# Patient Record
Sex: Male | Born: 1965 | Hispanic: No | Smoking: Current every day smoker
Health system: Southern US, Community
[De-identification: ages and names within clinical notes are randomized; demographics above are authoritative.]

## PROBLEM LIST (undated history)

## (undated) DIAGNOSIS — I10 Essential (primary) hypertension: Secondary | ICD-10-CM

## (undated) HISTORY — PX: OTHER SURGICAL HISTORY: SHX169

---

## 2001-08-21 ENCOUNTER — Emergency Department (HOSPITAL_COMMUNITY): Admission: EM | Admit: 2001-08-21 | Discharge: 2001-08-21 | Payer: Self-pay | Admitting: Emergency Medicine

## 2001-08-21 ENCOUNTER — Encounter: Payer: Self-pay | Admitting: Emergency Medicine

## 2002-06-16 ENCOUNTER — Emergency Department (HOSPITAL_COMMUNITY): Admission: EM | Admit: 2002-06-16 | Discharge: 2002-06-16 | Payer: Self-pay | Admitting: Emergency Medicine

## 2003-08-23 ENCOUNTER — Emergency Department (HOSPITAL_COMMUNITY): Admission: EM | Admit: 2003-08-23 | Discharge: 2003-08-23 | Payer: Self-pay | Admitting: Emergency Medicine

## 2006-09-12 ENCOUNTER — Emergency Department (HOSPITAL_COMMUNITY): Admission: EM | Admit: 2006-09-12 | Discharge: 2006-09-12 | Payer: Self-pay | Admitting: Emergency Medicine

## 2006-10-04 ENCOUNTER — Emergency Department (HOSPITAL_COMMUNITY): Admission: EM | Admit: 2006-10-04 | Discharge: 2006-10-04 | Payer: Self-pay | Admitting: Emergency Medicine

## 2006-11-06 ENCOUNTER — Ambulatory Visit (HOSPITAL_COMMUNITY): Admission: RE | Admit: 2006-11-06 | Discharge: 2006-11-06 | Payer: Self-pay | Admitting: Family Medicine

## 2006-12-09 ENCOUNTER — Ambulatory Visit (HOSPITAL_COMMUNITY): Admission: RE | Admit: 2006-12-09 | Discharge: 2006-12-09 | Payer: Self-pay | Admitting: General Surgery

## 2007-09-15 ENCOUNTER — Emergency Department (HOSPITAL_COMMUNITY): Admission: EM | Admit: 2007-09-15 | Discharge: 2007-09-15 | Payer: Self-pay | Admitting: Family Medicine

## 2007-09-18 ENCOUNTER — Emergency Department (HOSPITAL_COMMUNITY): Admission: EM | Admit: 2007-09-18 | Discharge: 2007-09-18 | Payer: Self-pay | Admitting: Emergency Medicine

## 2007-10-12 ENCOUNTER — Emergency Department (HOSPITAL_COMMUNITY): Admission: EM | Admit: 2007-10-12 | Discharge: 2007-10-12 | Payer: Self-pay | Admitting: Emergency Medicine

## 2009-08-18 ENCOUNTER — Ambulatory Visit (HOSPITAL_COMMUNITY): Admission: RE | Admit: 2009-08-18 | Discharge: 2009-08-18 | Payer: Self-pay | Admitting: Internal Medicine

## 2009-08-28 ENCOUNTER — Ambulatory Visit (HOSPITAL_COMMUNITY): Admission: RE | Admit: 2009-08-28 | Discharge: 2009-08-28 | Payer: Self-pay | Admitting: Orthopaedic Surgery

## 2009-10-06 ENCOUNTER — Ambulatory Visit (HOSPITAL_COMMUNITY): Admission: RE | Admit: 2009-10-06 | Discharge: 2009-10-06 | Payer: Self-pay | Admitting: Orthopaedic Surgery

## 2010-06-10 LAB — CBC
Hemoglobin: 13.8 g/dL (ref 13.0–17.0)
MCH: 28.5 pg (ref 26.0–34.0)
MCHC: 33.2 g/dL (ref 30.0–36.0)
Platelets: 213 10*3/uL (ref 150–400)
RDW: 13.6 % (ref 11.5–15.5)

## 2010-06-10 LAB — BASIC METABOLIC PANEL
BUN: 14 mg/dL (ref 6–23)
CO2: 29 mEq/L (ref 19–32)
Calcium: 9.2 mg/dL (ref 8.4–10.5)
Creatinine, Ser: 0.83 mg/dL (ref 0.4–1.5)
GFR calc non Af Amer: >60 mL/min (ref >60)
Glucose, Bld: 96 mg/dL (ref 70–99)

## 2010-06-10 LAB — URINALYSIS, ROUTINE W REFLEX MICROSCOPIC
Bilirubin Urine: NEGATIVE
Hgb urine dipstick: NEGATIVE
Ketones, ur: NEGATIVE mg/dL
Nitrite: NEGATIVE
Specific Gravity, Urine: 1.02 (ref 1.005–1.030)
Urobilinogen, UA: 0.2 mg/dL (ref 0.0–1.0)

## 2010-06-10 LAB — SURGICAL PCR SCREEN
MRSA, PCR: NEGATIVE
Staphylococcus aureus: NEGATIVE
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
## 2010-12-20 LAB — POCT URINALYSIS DIP (DEVICE)
Bilirubin Urine: NEGATIVE
Glucose, UA: NEGATIVE
Specific Gravity, Urine: <=1.005
Urobilinogen, UA: 0.2
pH: 5

## 2010-12-20 LAB — URINALYSIS, ROUTINE W REFLEX MICROSCOPIC
Bilirubin Urine: NEGATIVE
Glucose, UA: NEGATIVE
Hgb urine dipstick: NEGATIVE
Specific Gravity, Urine: 1.015

## 2010-12-21 LAB — POCT I-STAT, CHEM 8
BUN: 20
Chloride: 108
Creatinine, Ser: 1
Glucose, Bld: 91
HCT: 47
Potassium: 4.5

## 2010-12-21 LAB — URINALYSIS, ROUTINE W REFLEX MICROSCOPIC
Glucose, UA: NEGATIVE
Hgb urine dipstick: NEGATIVE
pH: 6

## 2010-12-21 LAB — POCT CARDIAC MARKERS
CKMB, poc: 1.3
Myoglobin, poc: 39.2
Troponin i, poc: <0.05

## 2010-12-21 LAB — CBC
HCT: 43.6
Hemoglobin: 14.9
RBC: 5.14
WBC: 10.2

## 2011-01-09 LAB — I-STAT 8, (EC8 V) (CONVERTED LAB)
BUN: 16
Bicarbonate: 24.6 — ABNORMAL HIGH
Chloride: 108
Glucose, Bld: 94
HCT: 41
Hemoglobin: 13.9
Operator id: 282751
Potassium: 4
Sodium: 141
TCO2: 26
pCO2, Ven: 39.6 — ABNORMAL LOW
pH, Ven: 7.401 — ABNORMAL HIGH

## 2011-01-09 LAB — URINALYSIS, ROUTINE W REFLEX MICROSCOPIC
Bilirubin Urine: NEGATIVE
Glucose, UA: NEGATIVE
Hgb urine dipstick: NEGATIVE
Ketones, ur: NEGATIVE
Nitrite: NEGATIVE
Protein, ur: NEGATIVE
Specific Gravity, Urine: >1.046 — ABNORMAL HIGH
Urobilinogen, UA: 0.2
pH: 6.5

## 2011-01-09 LAB — HEPATIC FUNCTION PANEL
Alkaline Phosphatase: 102
Total Protein: 6.5

## 2011-01-09 LAB — URINE CULTURE
Colony Count: NO GROWTH
Culture: NO GROWTH

## 2011-01-09 LAB — OCCULT BLOOD X 1 CARD TO LAB, STOOL: Fecal Occult Bld: NEGATIVE

## 2011-01-09 LAB — POCT I-STAT CREATININE
Creatinine, Ser: 1
Operator id: 282751

## 2013-04-23 ENCOUNTER — Other Ambulatory Visit: Payer: Self-pay | Admitting: Emergency Medicine

## 2013-04-23 ENCOUNTER — Ambulatory Visit
Admission: RE | Admit: 2013-04-23 | Discharge: 2013-04-23 | Disposition: A | Discharge status: Home / Self Care | Payer: BC Managed Care – PPO | Source: Ambulatory Visit | Attending: Emergency Medicine | Admitting: Emergency Medicine

## 2013-04-23 DIAGNOSIS — R1032 Left lower quadrant pain: Secondary | ICD-10-CM

## 2013-04-23 MED ORDER — IOHEXOL 300 MG/ML  SOLN
100.0000 mL | Freq: Once | INTRAMUSCULAR | Status: AC | PRN
  Administered 2013-04-23: 100 mL via INTRAVENOUS

## 2014-03-16 IMAGING — CT CT ABD-PELV W/ CM
2 of 5 series · 17 of 46 positions shown, 19 images · IV contrast (omnipaque)
Comparison: CT abdomen and pelvis without contrast 09/18/2007.

CLINICAL DATA: Left lower quadrant abdominal pain.

EXAM:
CT ABDOMEN AND PELVIS WITH CONTRAST
TECHNIQUE: Multidetector CT imaging of the abdomen and pelvis was performed
using the standard protocol following bolus administration of
intravenous contrast.
CONTRAST:  100mL OMNIPAQUE IOHEXOL 300 MG/ML  SOLN

[Series 2: abd/pelvis with · axial · 0.80mm/px · z∈[-420,+4]mm · 14 of 96 slices shown, 16 images]
[im 6/96  soft-tissue]
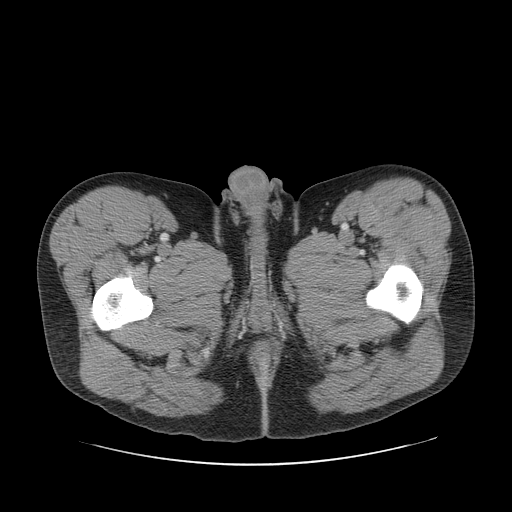
[im 6/96  bone]
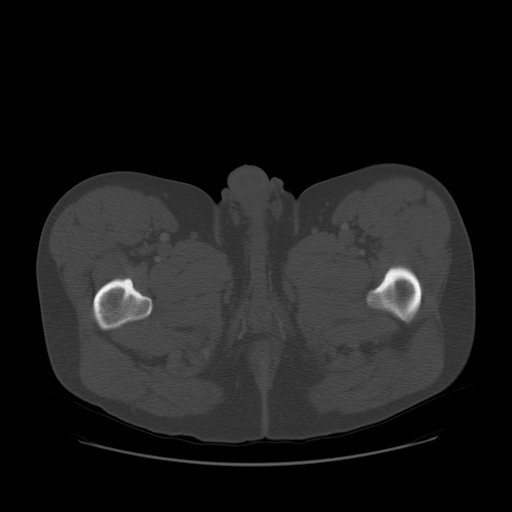
[im 11/96  soft-tissue]
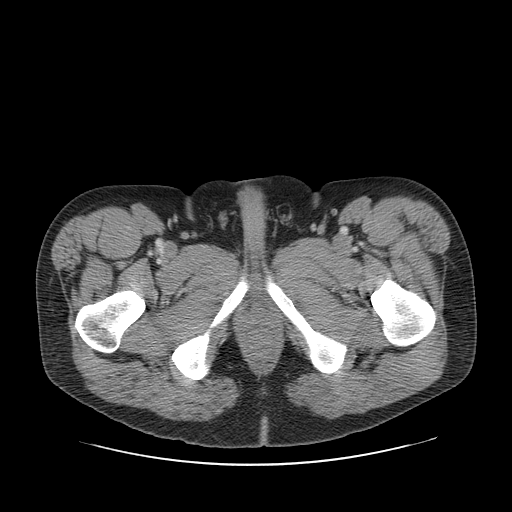
[im 21/96  soft-tissue]
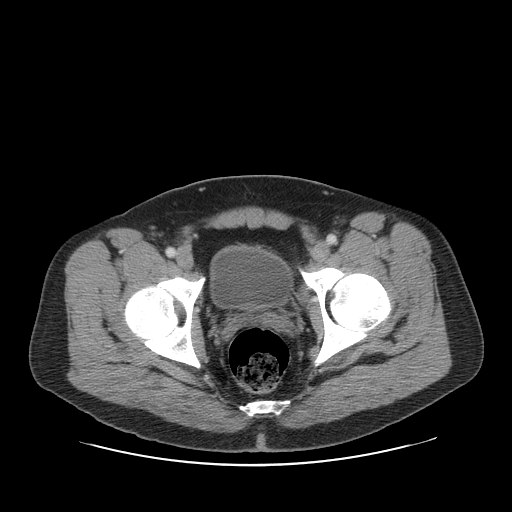
[im 26/96  soft-tissue]
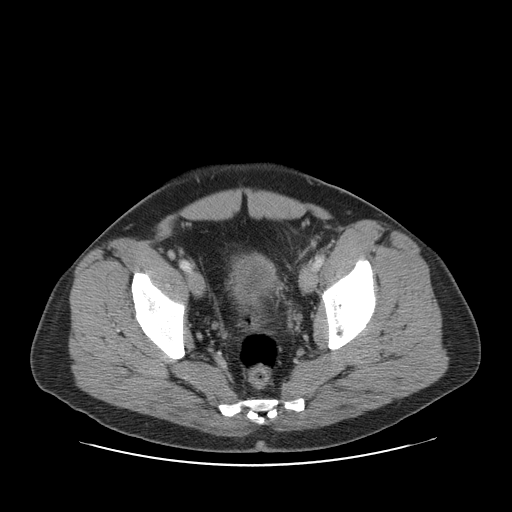
[im 31/96  soft-tissue]
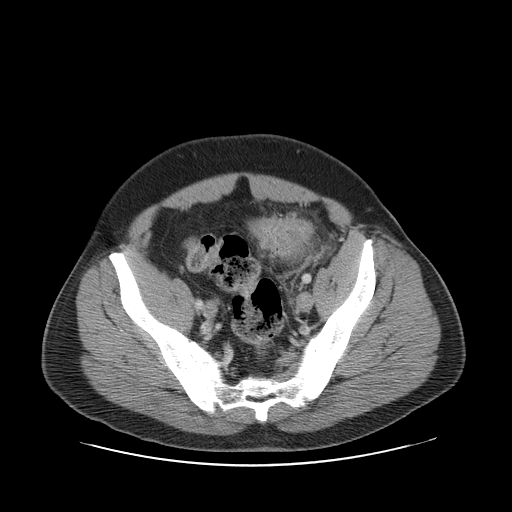
[im 41/96  soft-tissue]
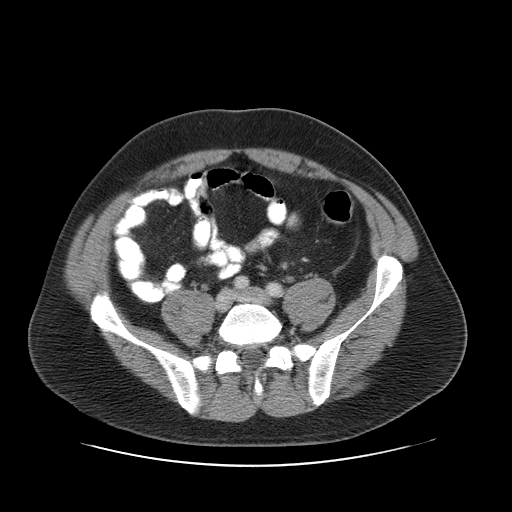
[im 46/96  soft-tissue]
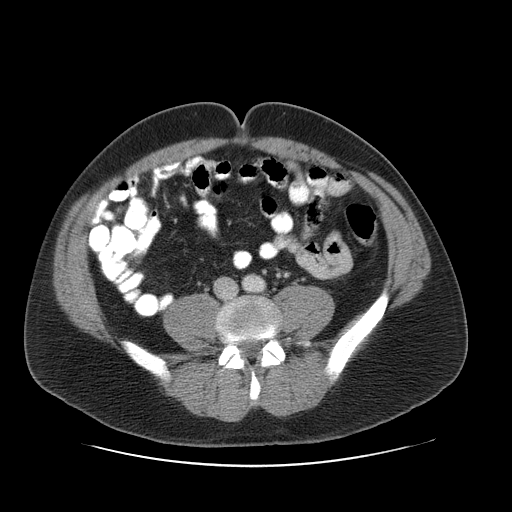
[im 51/96  soft-tissue]
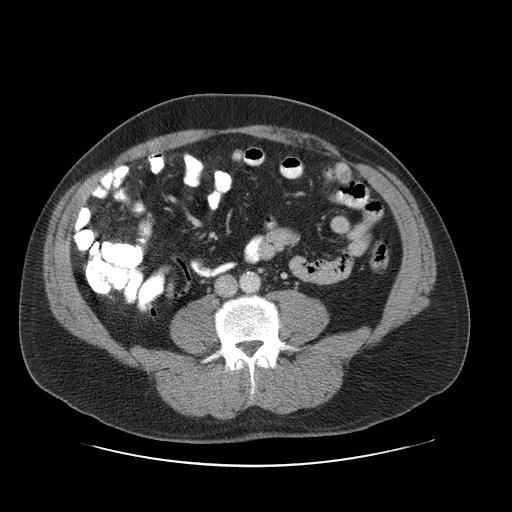
[im 56/96  soft-tissue]
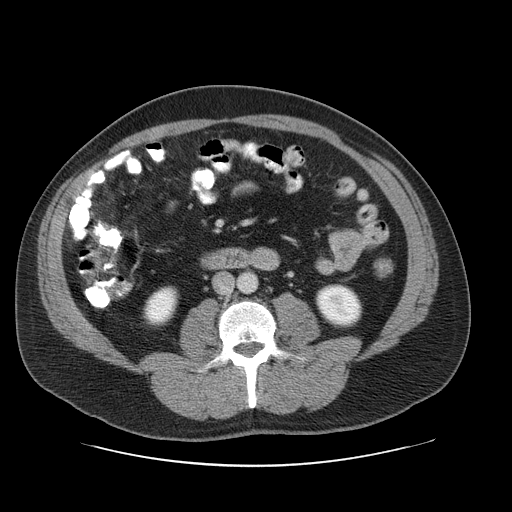
[im 56/96  bone]
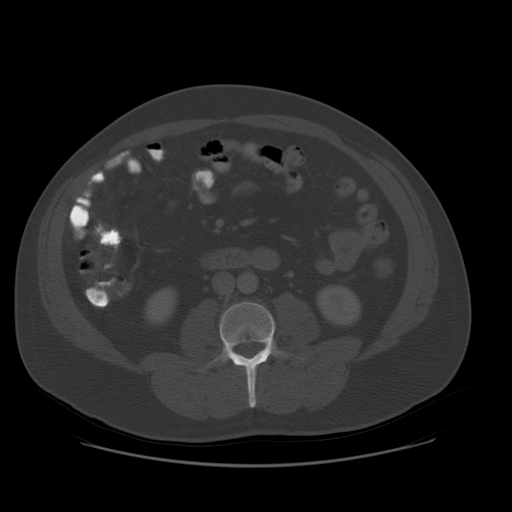
[im 66/96  soft-tissue]
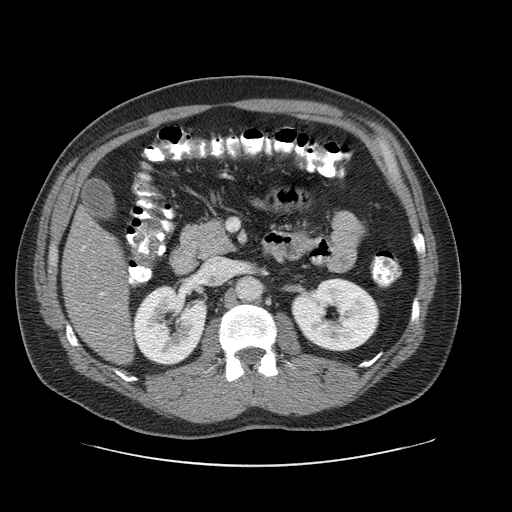
[im 71/96  soft-tissue]
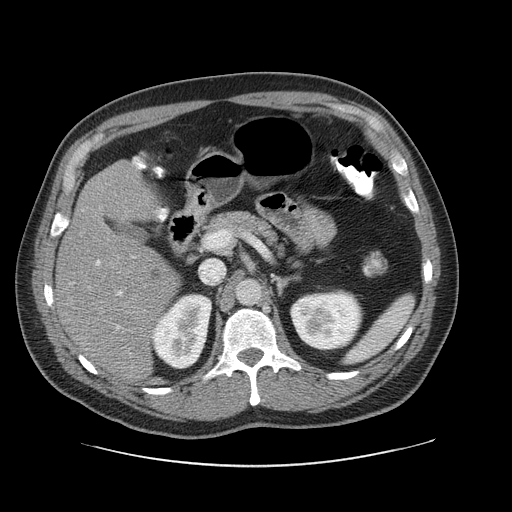
[im 76/96  soft-tissue]
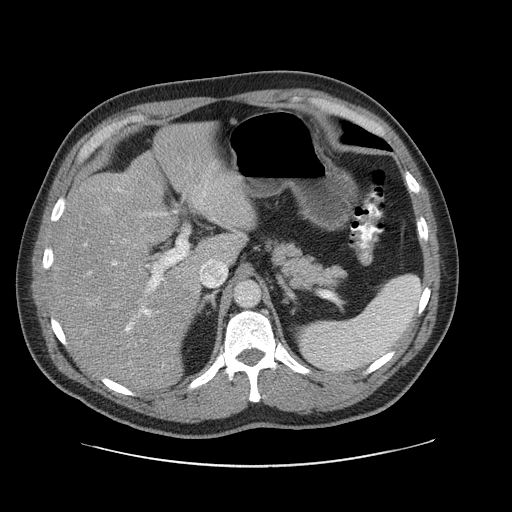
[im 86/96  soft-tissue]
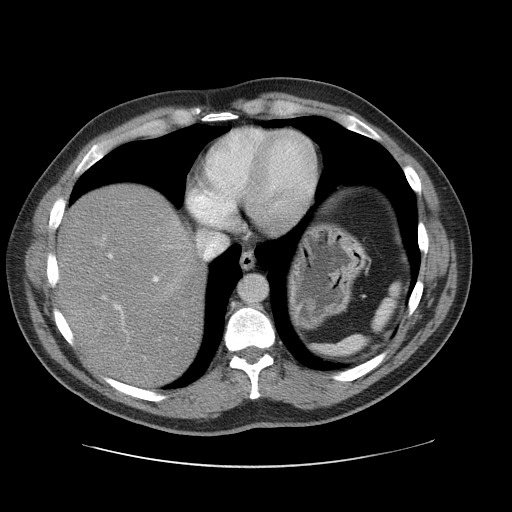
[im 91/96  soft-tissue]
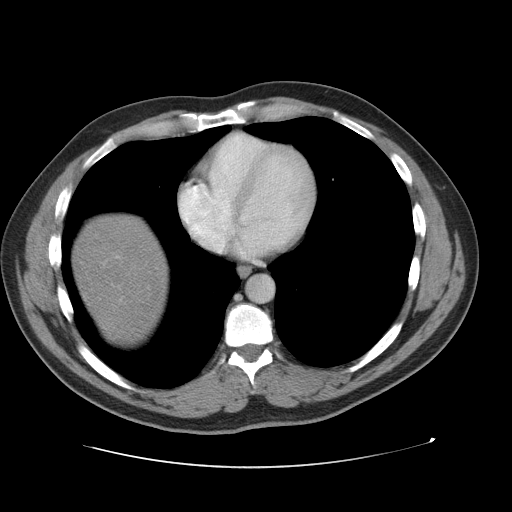

[Series 401: cor · coronal · 0.95mm/px · 3 of 126 slices shown]
[im 42/126  soft-tissue]
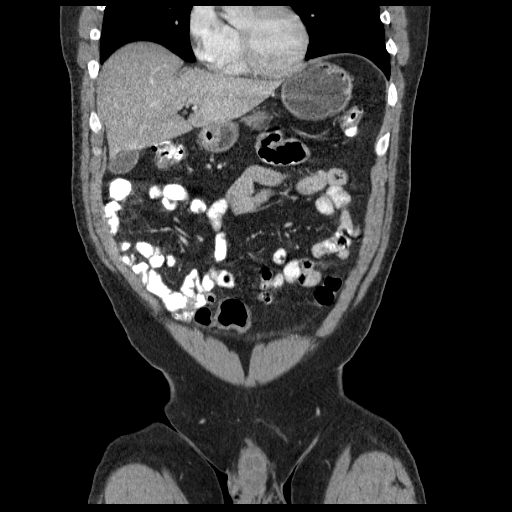
[im 56/126  soft-tissue]
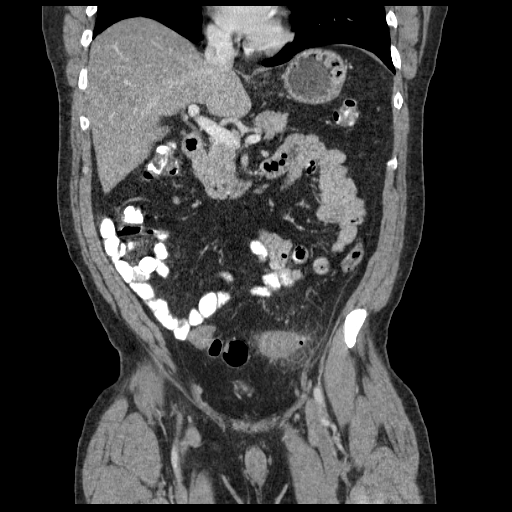
[im 70/126  soft-tissue]
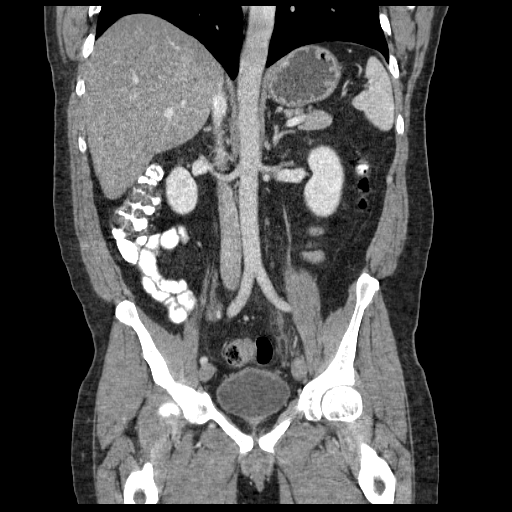

[17 of 46 positions shown; findings below may reference images not displayed]

FINDINGS: The lung bases are clear without focal nodule, mass, or airspace
disease. Heart size is normal. No significant pleural or pericardial
effusion is present.

Diffuse fatty infiltration of the liver is present. A hypodense
lesion inferiorly in the right lobe on image 25 of series 2 is
stable. The spleen is within normal limits. The stomach, duodenum,
and pancreas are unremarkable. The common bile duct and gallbladder
are normal. The adrenal glands are normal bilaterally. The kidneys
and ureters are within normal limits.

Inflammatory changes surround diverticula within the sigmoid colon
compatible with diverticulitis. There is no significant free air or
fluid collection to suggest abscess. Additional diverticular changes
are present within the descending colon without other areas of
inflammation. The remainder of the colon is within normal limits.
The appendix is visualized and normal. The small bowel is
unremarkable. No significant adenopathy or free fluid is present.

The bone windows are unremarkable.
IMPRESSION: 1. Sigmoid diverticulitis without complicating features.
2. Diffuse fatty infiltration of the liver.

## 2022-01-14 ENCOUNTER — Emergency Department (HOSPITAL_COMMUNITY): Payer: Managed Care, Other (non HMO)

## 2022-01-14 ENCOUNTER — Other Ambulatory Visit: Payer: Self-pay

## 2022-01-14 ENCOUNTER — Encounter (HOSPITAL_COMMUNITY): Payer: Self-pay

## 2022-01-14 ENCOUNTER — Emergency Department (HOSPITAL_COMMUNITY)
Admission: EM | Admit: 2022-01-14 | Discharge: 2022-01-14 | Disposition: A | Discharge status: Home / Self Care | Payer: Managed Care, Other (non HMO) | Attending: Emergency Medicine | Admitting: Emergency Medicine

## 2022-01-14 DIAGNOSIS — M542 Cervicalgia: Secondary | ICD-10-CM | POA: Diagnosis not present

## 2022-01-14 DIAGNOSIS — R0789 Other chest pain: Secondary | ICD-10-CM | POA: Diagnosis not present

## 2022-01-14 DIAGNOSIS — F17200 Nicotine dependence, unspecified, uncomplicated: Secondary | ICD-10-CM | POA: Insufficient documentation

## 2022-01-14 DIAGNOSIS — I10 Essential (primary) hypertension: Secondary | ICD-10-CM | POA: Diagnosis not present

## 2022-01-14 DIAGNOSIS — R079 Chest pain, unspecified: Secondary | ICD-10-CM | POA: Diagnosis present

## 2022-01-14 HISTORY — DX: Essential (primary) hypertension: I10

## 2022-01-14 LAB — TROPONIN I (HIGH SENSITIVITY)
Troponin I (High Sensitivity): 4 ng/L (ref <18)
Troponin I (High Sensitivity): 4 ng/L (ref <18)

## 2022-01-14 LAB — CBC
HCT: 44.9 % (ref 39.0–52.0)
Hemoglobin: 14.8 g/dL (ref 13.0–17.0)
MCH: 29.2 pg (ref 26.0–34.0)
MCHC: 33 g/dL (ref 30.0–36.0)
MCV: 88.7 fL (ref 80.0–100.0)
Platelets: 280 10*3/uL (ref 150–400)
RBC: 5.06 MIL/uL (ref 4.22–5.81)
RDW: 13.5 % (ref 11.5–15.5)
WBC: 9.1 10*3/uL (ref 4.0–10.5)
nRBC: 0 % (ref 0.0–0.2)

## 2022-01-14 LAB — BASIC METABOLIC PANEL
Anion gap: 11 (ref 5–15)
BUN: 10 mg/dL (ref 6–20)
CO2: 23 mmol/L (ref 22–32)
Calcium: 9.2 mg/dL (ref 8.9–10.3)
Chloride: 104 mmol/L (ref 98–111)
Creatinine, Ser: 0.79 mg/dL (ref 0.61–1.24)
GFR, Estimated: >60 mL/min (ref >60)
Glucose, Bld: 103 mg/dL — ABNORMAL HIGH (ref 70–99)
Potassium: 4.4 mmol/L (ref 3.5–5.1)
Sodium: 138 mmol/L (ref 135–145)

## 2022-01-14 LAB — D-DIMER, QUANTITATIVE: D-Dimer, Quant: <0.27 ug/mL-FEU (ref 0.00–0.50)

## 2022-01-14 MED ORDER — NAPROXEN 500 MG PO TABS: 500.0000 mg | ORAL_TABLET | Freq: Two times a day (BID) | ORAL | 0 refills | Status: DC | PRN

## 2022-01-14 MED ORDER — METHOCARBAMOL 750 MG PO TABS: 750.0000 mg | ORAL_TABLET | Freq: Four times a day (QID) | ORAL | 0 refills | Status: AC

## 2022-01-14 MED ORDER — LORAZEPAM 1 MG PO TABS
0.5000 mg | ORAL_TABLET | Freq: Once | ORAL | Status: AC
  Administered 2022-01-14: 0.5 mg via ORAL
  Filled 2022-01-14: qty 1

## 2022-01-14 NOTE — ED Provider Notes (Signed)
[facility] EMERGENCY DEPARTMENT Provider Note   CSN: [id number] Arrival date & time: 01/14/22  1037     History Chief Complaint  Patient presents with   Chest Pain    Grant Bishop is a 56 y.o. male  With a history of untreated HTN, current smoker presenting with persistent chest and neck pain  Patient was in his usual state of health until yesterday night when experienced intense, posterior neck pain that limited his neck motion associated with L middle, annular, and pinky. Patient woke up this morning with L sided chest pain that is constant in nature, and worsened with deep inspiration, movement of his neck, or palpation. Patient presented to UC and was sent to ED given presentation and risk factors for ACS evaluation.  The pain does not radiate and has not worsened. He has tried Advil but this has not helped his neck or chest pain. There is no associated SOB, n/v, HA, changes in vision. Patient denies leg pain with passive ROM, abdominal pain, URI symptoms, cough, or sick contacts. He has not had surgery or been in bed rest in the past few months. He denies history of acid reflux, inability to lay flat in bed, LE edema.  He is followed by a PCP in [place], [place]. He works at [employer] with low physical activity.   The history is provided by the patient. No language interpreter was used.  Chest Pain Pain location:  L chest Pain quality: dull and sharp   Pain quality: not burning, not crushing, not hot, no pressure, not radiating, not shooting, not stabbing, not tearing, not throbbing and no tightness   Pain radiates to:  Does not radiate Pain severity:  Moderate Onset quality:  Gradual Duration:  1 day Timing: constant at rest, worse with movement of neck. Progression:  Unchanged Chronicity:  New Context: breathing, lifting and at rest   Relieved by: Patient has tried Advil without relief. Associated symptoms: no abdominal pain, no altered mental status, no  anorexia, no anxiety, no back pain, no claudication, no cough, no diaphoresis, no dizziness, no dysphagia, no fatigue, no fever, no headache, no heartburn, no lower extremity edema, no nausea, no palpitations and no vomiting   Risk factors: hypertension, obesity and smoking        Home Medications Prior to Admission medications   Medication Sig Start Date End Date Taking? Authorizing Provider  methocarbamol (ROBAXIN-750) 750 MG tablet Take 1 tablet (750 mg total) by mouth 4 (four) times daily for 3 days. 01/14/22 01/17/22 Yes Morene Crocker, MD  naproxen (NAPROSYN) 500 MG tablet Take 1 tablet (500 mg total) by mouth 2 (two) times daily as needed for up to 30 doses (Take with meals twice a day as needed). 01/14/22  Yes Morene Crocker, MD    None  Allergies    Patient has no known allergies.    Review of Systems   Review of Systems  Constitutional:  Negative for diaphoresis, fatigue and fever.  HENT:  Negative for trouble swallowing.   Respiratory:  Negative for cough.   Cardiovascular:  Positive for chest pain. Negative for palpitations, claudication and leg swelling.  Gastrointestinal:  Negative for abdominal pain, anorexia, heartburn, nausea and vomiting.  Endocrine: Negative.   Genitourinary: Negative.   Musculoskeletal:  Positive for myalgias and neck pain. Negative for back pain.  Allergic/Immunologic: Negative.   Neurological:  Negative for dizziness, light-headedness and headaches.       Numbness in the L 3-4 fingers when  moving neck  Hematological: Negative.   Psychiatric/Behavioral: Negative.      Physical Exam Updated Vital Signs BP (!) 145/72   Pulse 61   Temp 98.5 F (36.9 C)   Resp 14   Ht 5\' 4"  (1.626 m)   Wt 81.6 kg   SpO2 97%   BMI 30.90 kg/m  Physical Exam Constitutional:      General: He is in acute distress.  Neck:     Vascular: No JVD.  Cardiovascular:     Rate and Rhythm: Normal rate and regular rhythm.     Pulses:           Carotid pulses are 2+ on the right side and 2+ on the left side.      Radial pulses are 2+ on the right side and 2+ on the left side.       Dorsalis pedis pulses are 2+ on the right side and 2+ on the left side.       Posterior tibial pulses are 2+ on the right side and 2+ on the left side.     Heart sounds: Normal heart sounds.  Pulmonary:     Effort: Pulmonary effort is normal.     Breath sounds: Normal breath sounds.  Abdominal:     General: Bowel sounds are normal.     Palpations: Abdomen is soft.  Musculoskeletal:     Right lower leg: No edema.     Left lower leg: No edema.     Comments: +Spurling test on the L neck Tenderness with ROM of neck and palpation to the L trapezius muscle, L neck, and anterior chest with reproducibility of sharp pain. There is also +numbness with maneuvers and neck ROM.  Lymphadenopathy:     Cervical: No cervical adenopathy.  Neurological:     General: No focal deficit present.     Mental Status: He is alert and oriented to person, place, and time.  Psychiatric:        Mood and Affect: Mood normal.        Behavior: Behavior normal.     ED Results / Procedures / Treatments   Labs (all labs ordered are listed, but only abnormal results are displayed) Labs Reviewed  BASIC METABOLIC PANEL - Abnormal; Notable for the following components:      Result Value   Glucose, Bld 103 (*)    All other components within normal limits  CBC  D-DIMER, QUANTITATIVE  TROPONIN I (HIGH SENSITIVITY)  TROPONIN I (HIGH SENSITIVITY)    EKG EKG Interpretation  Date/Time:  Monday January 14 2022 10:27:43 EDT Ventricular Rate:  73 PR Interval:  152 QRS Duration: 76 QT Interval:  378 QTC Calculation: 416 R Axis:   88 Text Interpretation: Normal sinus rhythm Normal ECG When compared with ECG of 12-Oct-2007 10:40, PREVIOUS ECG IS PRESENT Confirmed by 14-Oct-2007 [phone]) on 01/14/2022 11:26:50 AM  Radiology DG Chest 2 View  Result Date: 01/14/2022 CLINICAL  DATA:  One day history of left chest pain EXAM: CHEST - 2 VIEW COMPARISON:  Chest radiograph dated 10/12/2007 FINDINGS: Normal lung volumes. No focal consolidations. No pleural effusion or pneumothorax. The heart size and mediastinal contours are within normal limits. The visualized skeletal structures are unremarkable. IMPRESSION: Clear lungs.  Normal heart size. Electronically Signed   By: 10/14/2007 M.D.   On: 01/14/2022 11:19    Procedures Procedures    Medications Ordered in ED Medications  LORazepam (ATIVAN) tablet 0.5 mg (0.5 mg Oral Given  01/14/22 1234)    ED Course/ Medical Decision Making/ A&P                           Medical Decision Making Amount and/or Complexity of Data Reviewed Labs: ordered. Radiology: ordered.  Risk Prescription drug management.   56yo male living with a history of untreated hypertension, current smoker, and family history of MI presenting with chest and neck pain.  Differential diagnoses include ACS, PE, musculoskeletal pain, aortic dissection, GERD.   Patient does have risk factors for ACS. However, with low heart score, non ST elevation on EKG, first troponin 4, there is decreased likelihood his presentation is secondary to an ischemic event. Wells criteria for DVT 0 points; however, Ddmer ordered to work up potential DVT in setting of pleuritic chest pain. No mediastinal widening on CXR and bilateral, symmetrical radial pulse decrease the suspicion for aortic dissection.   His clinical presentation is consistent with musculoskeletal pain with radiculopathy as it is reproducible on exam. Will await second troponin and Ddimer tests. If negative, patient can be discharged home with a course of muscle relaxant and NSAIDs.   2:13PM: Patient was given one dose of ativan with improvement of pain and ROM. BP: 144/84. DDimer and second troponin pending.   2:48PM: Ddimer < 0.27  3:13PM Troponin 4, no concerns for ACS at this time.   Patient's pain has  improved, he is HDS, and safe for discharge. Will prescribe a course of Robaxin  and Naproxen PRN for 5 days. Patient has an upcoming follow up appointment with his PCP on 11/7. Precautions to return to ED were reviewed. Patient agrees with plan.    Final Clinical Impression(s) / ED Diagnoses Final diagnoses:  Atypical chest pain    Rx / DC Orders ED Discharge Orders          Ordered    methocarbamol (ROBAXIN-750) 750 MG tablet  4 times daily        01/14/22 1431    naproxen (NAPROSYN) 500 MG tablet  2 times daily PRN        01/14/22 1431              Romana Juniper, MD 01/14/22 1514    Isla Pence, MD 01/14/22 1517

## 2022-01-14 NOTE — Discharge Instructions (Addendum)
Mr.  Bishop, Blahnik were seen in the ED for chest and neck pain. After physical examination, blodd tests, and chest xray, we believe the reason for your pain in a muscle spasm/sprain. We are prescribing you a course of Naproxen to take twice a day as needed with your meals and a course Robaxin to take up to 4 times per day, for 3 days, as you needed. Please don't drive and take that medication.   Please follow up with your doctor as you will need to be seen for your untreated high blood pressure. You would also benefit from tests like your cholesterol and hemoglobin A1c to test you for diabetes.  Take care of yourself, Romana Juniper, MD

## 2022-01-14 NOTE — ED Triage Notes (Signed)
Reports started having neck pain on Saturday and developed into chest pain.  Reports pain radiates down left arm.  Denies SOB n/v

## 2022-01-29 ENCOUNTER — Ambulatory Visit: Payer: Self-pay | Admitting: Nurse Practitioner

## 2022-03-26 ENCOUNTER — Encounter: Payer: Self-pay | Admitting: Nurse Practitioner

## 2022-03-26 ENCOUNTER — Ambulatory Visit (INDEPENDENT_AMBULATORY_CARE_PROVIDER_SITE_OTHER): Payer: Managed Care, Other (non HMO) | Admitting: Nurse Practitioner

## 2022-03-26 VITALS — BP 138/72 | HR 84 | Ht 64.0 in | Wt 202.0 lb

## 2022-03-26 DIAGNOSIS — Z1211 Encounter for screening for malignant neoplasm of colon: Secondary | ICD-10-CM

## 2022-03-26 DIAGNOSIS — Z23 Encounter for immunization: Secondary | ICD-10-CM

## 2022-03-26 DIAGNOSIS — E669 Obesity, unspecified: Secondary | ICD-10-CM

## 2022-03-26 DIAGNOSIS — Z0000 Encounter for general adult medical examination without abnormal findings: Secondary | ICD-10-CM

## 2022-03-26 DIAGNOSIS — Z720 Tobacco use: Secondary | ICD-10-CM

## 2022-03-26 DIAGNOSIS — Z125 Encounter for screening for malignant neoplasm of prostate: Secondary | ICD-10-CM | POA: Diagnosis not present

## 2022-03-26 LAB — POCT URINALYSIS DIPSTICK
Bilirubin, UA: NEGATIVE
Blood, UA: NEGATIVE
Glucose, UA: NEGATIVE
Ketones, UA: NEGATIVE
Leukocytes, UA: NEGATIVE
Nitrite, UA: NEGATIVE
Protein, UA: NEGATIVE
Spec Grav, UA: >=1.03 — AB (ref 1.010–1.025)
Urobilinogen, UA: 0.2 E.U./dL
pH, UA: 5.5 (ref 5.0–8.0)

## 2022-03-26 LAB — LIPID PANEL
Cholesterol: 228 mg/dL — ABNORMAL HIGH (ref 0–200)
HDL: 31.3 mg/dL — ABNORMAL LOW (ref >39.00)
NonHDL: 197.17
Total CHOL/HDL Ratio: 7
Triglycerides: 328 mg/dL — ABNORMAL HIGH (ref 0.0–149.0)
VLDL: 65.6 mg/dL — ABNORMAL HIGH (ref 0.0–40.0)

## 2022-03-26 LAB — CBC
HCT: 40.8 % (ref 39.0–52.0)
Hemoglobin: 13.6 g/dL (ref 13.0–17.0)
MCHC: 33.3 g/dL (ref 30.0–36.0)
MCV: 87.1 fl (ref 78.0–100.0)
Platelets: 289 10*3/uL (ref 150.0–400.0)
RBC: 4.69 Mil/uL (ref 4.22–5.81)
RDW: 13.6 % (ref 11.5–15.5)
WBC: 8.5 10*3/uL (ref 4.0–10.5)

## 2022-03-26 LAB — COMPREHENSIVE METABOLIC PANEL
ALT: 58 U/L — ABNORMAL HIGH (ref 0–53)
AST: 26 U/L (ref 0–37)
Albumin: 4 g/dL (ref 3.5–5.2)
Alkaline Phosphatase: 125 U/L — ABNORMAL HIGH (ref 39–117)
BUN: 15 mg/dL (ref 6–23)
CO2: 26 mEq/L (ref 19–32)
Calcium: 9 mg/dL (ref 8.4–10.5)
Chloride: 106 mEq/L (ref 96–112)
Creatinine, Ser: 0.87 mg/dL (ref 0.40–1.50)
GFR: 95.09 mL/min (ref >60.00)
Glucose, Bld: 93 mg/dL (ref 70–99)
Potassium: 4.8 mEq/L (ref 3.5–5.1)
Sodium: 141 mEq/L (ref 135–145)
Total Bilirubin: 0.3 mg/dL (ref 0.2–1.2)
Total Protein: 7 g/dL (ref 6.0–8.3)

## 2022-03-26 LAB — LDL CHOLESTEROL, DIRECT: Direct LDL: 143 mg/dL

## 2022-03-26 LAB — PSA: PSA: 5.87 ng/mL — ABNORMAL HIGH (ref 0.10–4.00)

## 2022-03-26 LAB — HEMOGLOBIN A1C: Hgb A1c MFr Bld: 6.3 % (ref 4.6–6.5)

## 2022-03-26 LAB — TSH: TSH: 1.72 u[IU]/mL (ref 0.35–5.50)

## 2022-03-26 NOTE — Assessment & Plan Note (Signed)
Patient states he walks approximately 5 miles at work.  Did encourage him to do exercise outside of his employment.  He can also do resistance training at home.  Detailed recommendations 30 minutes of exercise 5 times a week

## 2022-03-26 NOTE — Assessment & Plan Note (Signed)
Patient had stopped smoking in the past.  States he lost his son approximate 2 years ago started smoking again states 1 pack of cigarettes lasts a week for him.  Will check urine for microscopic hematuria.

## 2022-03-26 NOTE — Progress Notes (Signed)
New Patient Office Visit  Subjective    Patient ID: Grant Bishop, male    DOB: [birth year]  Age: 57 y.o. MRN: [id number]  CC:  Chief Complaint  Patient presents with   Establish Care         HPI Grant Bishop presents to establish care  for complete physical and follow up of chronic conditions.  Immunizations: -Tetanus: 5 years ago -Influenza: refused -Shingles: up today  -Pneumonia: Too young Covid: 2 and one booster  -HPV: aged out  Diet: [place]. States that he eats 2 meals and some snacks. Healthier snacks. States that he will drink water and sweet tea.  Occasional soda Exercise: No regular exercise.  Eye exam: PRN  Dental exam: Completes semi-annually   Colonoscopy: Completed in over 10 years ago. Would like referral for [facility] Lung Cancer Screening: does not qualify  Dexa: NA  PSA: Due  Sleep: states he goes to bed aorun 11-130 and gets up aroun 530-6. Feels rested and does snore     Outpatient Encounter Medications as of 03/26/2022  Medication Sig   [DISCONTINUED] naproxen (NAPROSYN) 500 MG tablet Take 1 tablet (500 mg total) by mouth 2 (two) times daily as needed for up to 30 doses (Take with meals twice a day as needed).   No facility-administered encounter medications on file as of 03/26/2022.    Past Medical History:  Diagnosis Date   Hypertension     Past Surgical History:  Procedure Laterality Date   right knee replacement Right    54 yrs ago    Family History  Problem Relation Age of Onset   COPD Mother        breast   Cancer Mother    Alcohol abuse Father    Cirrhosis Father     Social History   Socioeconomic History   Marital status: Divorced    Spouse name: Not on file   Number of children: 1   Years of education: Not on file   Highest education level: Not on file  Occupational History   Not on file  Tobacco Use   Smoking status: Every Day    Packs/day: 0.25    Years: 12.00    Total pack years: 3.00    Types: Cigarettes    Smokeless tobacco: Never  Vaping Use   Vaping Use: Never used  Substance and Sexual Activity   Alcohol use: Not Currently    Comment: Twice a month   Drug use: Never   Sexual activity: Not on file  Other Topics Concern   Not on file  Social History Narrative   Son that has pasted   2 adopted daughters   [place]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  Respiratory:  Negative for shortness of breath.   Cardiovascular:  Negative for chest pain and leg swelling.  Gastrointestinal:  Negative for abdominal pain, blood in stool, constipation, diarrhea, nausea and vomiting.       Daily BM  Genitourinary:  Negative for dysuria and hematuria.  Neurological:  Negative for headaches.  Psychiatric/Behavioral:  Negative for hallucinations and suicidal ideas.  Objective    BP 138/72   Pulse 84   Ht 5\' 4"  (1.626 m)   Wt 202 lb (91.6 kg)   SpO2 95%   BMI 34.67 kg/m   Physical Exam Vitals and nursing note reviewed.  Constitutional:      Appearance: Normal appearance. He is obese.  HENT:     Right Ear: Tympanic membrane, ear canal and external ear normal.     Left Ear: Tympanic membrane, ear canal and external ear normal.     Mouth/Throat:     Mouth: Mucous membranes are moist.     Pharynx: Oropharynx is clear.  Eyes:     Extraocular Movements: Extraocular movements intact.     Pupils: Pupils are equal, round, and reactive to light.  Cardiovascular:     Rate and Rhythm: Normal rate and regular rhythm.     Heart sounds: Normal heart sounds.  Pulmonary:     Effort: Pulmonary effort is normal.     Breath sounds: Normal breath sounds.   Abdominal:     General: Bowel sounds are normal. There is no distension.     Palpations: There is no mass.     Tenderness: There is no abdominal tenderness.     Hernia: No hernia is present.  Genitourinary:    Comments: Deferred per patient  Musculoskeletal:     Right lower leg: No edema.     Left lower leg: No edema.  Lymphadenopathy:     Cervical: No cervical adenopathy.  Skin:    General: Skin is warm.  Neurological:     General: No focal deficit present.     Mental Status: He is alert.     Deep Tendon Reflexes:     Reflex Scores:      Bicep reflexes are 2+ on the right side and 2+ on the left side.      Patellar reflexes are 2+ on the right side and 2+ on the left side.    Comments: Bilateral upper and lower extremity strength 5/5  Psychiatric:        Mood and Affect: Mood normal.        Behavior: Behavior normal.        Thought Content: Thought content normal.        Judgment: Judgment normal.         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  Patient refused flu vaccine.  Did administer first shingles vaccine in office.  Patient is due for colorectal cancer screening and prostate cancer screening both orders placed today.  Patient was given information at discharge in regards to healthy lifestyle modifications and preventative healthcare maintenance with anticipatory guidance for his age range.  Encourage patient to do exercise 30 minutes a day 5 times a week.      Relevant Orders   CBC   Comprehensive metabolic panel   Hemoglobin A1c   TSH   Lipid panel   Obesity (BMI 30-39.9)    Patient states he walks approximately 5 miles at work.  Did encourage him to do exercise outside of his employment.  He can also do resistance training at home.  Detailed recommendations 30 minutes of exercise 5 times a week      Relevant Orders    Hemoglobin A1c   Lipid panel   Tobacco use    Patient  had stopped smoking in the past.  States he lost his son approximate 2 years ago started smoking again states 1 pack of cigarettes lasts a week for him.  Will check urine for microscopic hematuria.      Relevant Orders   POCT urinalysis dipstick   Other Visit Diagnoses     Need for shingles vaccine       Relevant Orders   Zoster Recombinant (Shingrix ) (Completed)   Screening for prostate cancer       Relevant Orders   PSA   Screening for colon cancer       Relevant Orders   Ambulatory referral to Gastroenterology       Return in about 1 year (around 03/27/2023) for CPE and lab.   Romilda Garret, NP

## 2022-03-26 NOTE — Assessment & Plan Note (Signed)
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  Patient refused flu vaccine.  Did administer first shingles vaccine in office.  Patient is due for colorectal cancer screening and prostate cancer screening both orders placed today.  Patient was given information at discharge in regards to healthy lifestyle modifications and preventative healthcare maintenance with anticipatory guidance for his age range.  Encourage patient to do exercise 30 minutes a day 5 times a week.

## 2022-03-26 NOTE — Patient Instructions (Addendum)
Nice to see you today I will be in touch with the labs once I have them Follow up with me in 1 year for your next physical, sooner if you need me  We gave you the first shingles vaccine Shigrix today You need to make a nurse visit in 4 months for the second one

## 2022-03-27 ENCOUNTER — Emergency Department (HOSPITAL_COMMUNITY)
Admission: EM | Admit: 2022-03-27 | Discharge: 2022-03-28 | Disposition: A | Discharge status: Home / Self Care | Payer: Managed Care, Other (non HMO) | Attending: Emergency Medicine | Admitting: Emergency Medicine

## 2022-03-27 ENCOUNTER — Other Ambulatory Visit: Payer: Self-pay | Admitting: Nurse Practitioner

## 2022-03-27 ENCOUNTER — Emergency Department (HOSPITAL_COMMUNITY): Payer: Managed Care, Other (non HMO)

## 2022-03-27 DIAGNOSIS — R7989 Other specified abnormal findings of blood chemistry: Secondary | ICD-10-CM

## 2022-03-27 DIAGNOSIS — R972 Elevated prostate specific antigen [PSA]: Secondary | ICD-10-CM

## 2022-03-27 DIAGNOSIS — R0781 Pleurodynia: Secondary | ICD-10-CM | POA: Insufficient documentation

## 2022-03-27 DIAGNOSIS — M546 Pain in thoracic spine: Secondary | ICD-10-CM | POA: Insufficient documentation

## 2022-03-27 DIAGNOSIS — F17210 Nicotine dependence, cigarettes, uncomplicated: Secondary | ICD-10-CM | POA: Insufficient documentation

## 2022-03-27 DIAGNOSIS — R079 Chest pain, unspecified: Secondary | ICD-10-CM

## 2022-03-27 LAB — CBC WITH DIFFERENTIAL/PLATELET
Abs Immature Granulocytes: 0.06 10*3/uL (ref 0.00–0.07)
Basophils Absolute: 0.1 10*3/uL (ref 0.0–0.1)
Basophils Relative: 1 %
Eosinophils Absolute: 0.1 10*3/uL (ref 0.0–0.5)
Eosinophils Relative: 2 %
HCT: 41.7 % (ref 39.0–52.0)
Hemoglobin: 13.4 g/dL (ref 13.0–17.0)
Immature Granulocytes: 1 %
Lymphocytes Relative: 18 %
Lymphs Abs: 1.7 10*3/uL (ref 0.7–4.0)
MCH: 28.8 pg (ref 26.0–34.0)
MCHC: 32.1 g/dL (ref 30.0–36.0)
MCV: 89.5 fL (ref 80.0–100.0)
Monocytes Absolute: 0.7 10*3/uL (ref 0.1–1.0)
Monocytes Relative: 8 %
Neutro Abs: 6.7 10*3/uL (ref 1.7–7.7)
Neutrophils Relative %: 70 %
Platelets: 257 10*3/uL (ref 150–400)
RBC: 4.66 MIL/uL (ref 4.22–5.81)
RDW: 13.2 % (ref 11.5–15.5)
WBC: 9.4 10*3/uL (ref 4.0–10.5)
nRBC: 0 % (ref 0.0–0.2)

## 2022-03-27 LAB — COMPREHENSIVE METABOLIC PANEL
ALT: 46 U/L — ABNORMAL HIGH (ref 0–44)
AST: 24 U/L (ref 15–41)
Albumin: 3.6 g/dL (ref 3.5–5.0)
Alkaline Phosphatase: 118 U/L (ref 38–126)
Anion gap: 8 (ref 5–15)
BUN: 12 mg/dL (ref 6–20)
CO2: 23 mmol/L (ref 22–32)
Calcium: 8.6 mg/dL — ABNORMAL LOW (ref 8.9–10.3)
Chloride: 104 mmol/L (ref 98–111)
Creatinine, Ser: 1.22 mg/dL (ref 0.61–1.24)
GFR, Estimated: >60 mL/min (ref >60)
Glucose, Bld: 103 mg/dL — ABNORMAL HIGH (ref 70–99)
Potassium: 3.8 mmol/L (ref 3.5–5.1)
Sodium: 135 mmol/L (ref 135–145)
Total Bilirubin: 0.8 mg/dL (ref 0.3–1.2)
Total Protein: 7.1 g/dL (ref 6.5–8.1)

## 2022-03-27 LAB — TROPONIN I (HIGH SENSITIVITY): Troponin I (High Sensitivity): 4 ng/L (ref <18)

## 2022-03-27 NOTE — ED Triage Notes (Signed)
Patient here with complaint of sharp left sided chest pain that started at approximately 0900 this morning. Patient denies shortness of breath, denies nausea and vomiting. Patient reports being seen for same a few months ago but states they were unable to determine cause of pain.

## 2022-03-27 NOTE — ED Provider Triage Note (Signed)
Emergency Medicine Provider Triage Evaluation Note  Grant Bishop , a 57 y.o. male  was evaluated in triage.  Pt complains of chest pain.  Patient reports that he was getting into his truck earlier today when he noticed a sudden onset of chest pain with some radiation into his left shoulder.  Patient denies any nausea, vomiting, abdominal pain, shortness of breath.  States that he was in the ER about a month ago for similar symptoms..  Review of Systems  Positive: As above Negative: As above  Physical Exam  There were no vitals taken for this visit. Gen:   Awake, no distress  Resp:  Normal effort MSK:   Moves extremities without difficulty  Other:    Medical Decision Making  Medically screening exam initiated at 2:50 PM.  Appropriate orders placed.  Grant Bishop was informed that the remainder of the evaluation will be completed by another provider, this initial triage assessment does not replace that evaluation, and the importance of remaining in the ED until their evaluation is complete.     Luvenia Heller, PA-C 03/27/22 1451

## 2022-03-28 LAB — TROPONIN I (HIGH SENSITIVITY): Troponin I (High Sensitivity): 5 ng/L (ref <18)

## 2022-03-28 MED ORDER — CYCLOBENZAPRINE HCL 10 MG PO TABS: 10.0000 mg | ORAL_TABLET | Freq: Two times a day (BID) | ORAL | 0 refills | Status: DC | PRN

## 2022-03-28 MED ORDER — IBUPROFEN 600 MG PO TABS: 600.0000 mg | ORAL_TABLET | Freq: Four times a day (QID) | ORAL | 0 refills | Status: AC | PRN

## 2022-03-28 NOTE — ED Provider Notes (Signed)
[facility] EMERGENCY DEPARTMENT Provider Note   CSN: [id number] Arrival date & time: 03/27/22  1354     History  Chief Complaint  Patient presents with   Chest Pain    Grant Bishop is a 57 y.o. male presenting to the ED with back pain and chest pain.  Patient reports abrupt onset of sharp left-sided chest pain and back pain that began around 9 AM yesterday morning.  He says he was just getting into his truck at the time.  This feels similar to an episode he had a few months ago he came to the ED, and per my review of records, that time had normal troponins and a negative D-dimer.  He says at that time he was treated with muscle relaxers and his pain gradually eased up and went away after 3 days.  He has not had any issues until now.  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  He says currently his pain has been sharp and persistent, there is some pleuritic component, but is significantly worse attempting to overhead raise his left arm.  It was persistent overnight.  He has not taken any medications for it at home.  He denies history of coronary disease, diabetes.  He reports he has some high blood pressure.  Also reports that he does smoke occasional cigarettes.  Denies history of DVT or PE or family history of this.  Denies estrogen use or recent surgeries or prolonged immobilization.  HPI     Home Medications Prior to Admission medications   Medication Sig Start Date End Date Taking? Authorizing Provider  cyclobenzaprine (FLEXERIL) 10 MG tablet Take 1 tablet (10 mg total) by mouth 2 (two) times daily as needed for up to 15 doses for muscle spasms. 03/28/22  Yes Arline Ketter, Carola Rhine, MD  ibuprofen (ADVIL) 600 MG tablet Take 1 tablet (600 mg total) by mouth every 6 (six) hours as needed for mild pain or moderate pain. 03/28/22 04/27/22 Yes Amadeo Coke, Carola Rhine, MD      Allergies    Patient has no known allergies.    Review of Systems   Review of  Systems  Physical Exam Updated Vital Signs BP (!) 145/86   Pulse 69   Temp 98 F (36.7 C)   Resp 17   SpO2 96%  Physical Exam Constitutional:      General: He is not in acute distress. HENT:     Head: Normocephalic and atraumatic.  Eyes:     Conjunctiva/sclera: Conjunctivae normal.     Pupils: Pupils are equal, round, and reactive to light.  Cardiovascular:     Rate and Rhythm: Normal rate and regular rhythm.  Pulmonary:     Effort: Pulmonary effort is normal. No respiratory distress.  Chest:     Comments: Significant suprascapular subscapular tenderness, paraspinal tenderness of the left upper thoracic back.  Pain is worse and elicited with overhead arm raise of the left arm. Abdominal:     General: There is no distension.     Tenderness: There is no abdominal tenderness.  Skin:    General: Skin is warm and dry.  Neurological:     General: No focal deficit present.     Mental Status: He is alert. Mental status is at baseline.  Psychiatric:        Mood and Affect: Mood normal.        Behavior: Behavior normal.     ED Results /  Procedures / Treatments   Labs (all labs ordered are listed, but only abnormal results are displayed) Labs Reviewed  COMPREHENSIVE METABOLIC PANEL - Abnormal; Notable for the following components:      Result Value   Glucose, Bld 103 (*)    Calcium 8.6 (*)    ALT 46 (*)    All other components within normal limits  RESP PANEL BY RT-PCR (RSV, FLU A&B, COVID)  RVPGX2  CBC WITH DIFFERENTIAL/PLATELET  TROPONIN I (HIGH SENSITIVITY)  TROPONIN I (HIGH SENSITIVITY)    EKG EKG Interpretation  Date/Time:  Wednesday March 27 2022 15:03:58 EST Ventricular Rate:  83 PR Interval:  148 QRS Duration: 80 QT Interval:  344 QTC Calculation: 404 R Axis:   89 Text Interpretation: Normal sinus rhythm Normal ECG When compared with ECG of 14-Jan-2022 10:27, PREVIOUS ECG IS PRESENT Confirmed by Octaviano Glow [phone]) on 03/28/2022 8:24:51  AM  Radiology DG Chest 1 View  Result Date: 03/27/2022 CLINICAL DATA:  Chest pain EXAM: CHEST  1 VIEW COMPARISON:  Chest radiograph 01/14/2022 FINDINGS: The cardiomediastinal silhouette is normal There is no focal consolidation or pulmonary edema. There is no pleural effusion or pneumothorax There is no acute osseous abnormality. IMPRESSION: No radiographic evidence of acute cardiopulmonary process. Electronically Signed   By: Valetta Mole M.D.   On: 03/27/2022 15:37    Procedures Procedures    Medications Ordered in ED Medications - No data to display  ED Course/ Medical Decision Making/ A&P                           Medical Decision Making Risk Prescription drug management.   This patient presents to the ED with concern for chest pain and back pain. This involves an extensive number of treatment options, and is a complaint that carries with it a high risk of complications and morbidity.  The differential diagnosis includes musculoskeletal pain most likely versus atypical ACS versus pneumonia versus pneumothorax versus other  Co-morbidities that complicate the patient evaluation: Patient does have some coronary risk factors of smoking and high blood pressure  External records from outside source obtained and reviewed including prior ED workup including D-dimer which was normal and normal troponins  I ordered and personally interpreted labs.  The pertinent results include: Delta troponin unremarkable.  Basic labs unremarkable  I ordered imaging studies including x-ray of the chest I independently visualized and interpreted imaging which showed no emergent process I agree with the radiologist interpretation  The patient was maintained on a cardiac monitor.  I personally viewed and interpreted the cardiac monitored which showed an underlying rhythm of: Sinus rhythm  Per my interpretation the patient's ECG shows normal sinus rhythm no acute ischemic findings  Test Considered: I have  a lower clinical suspicion for pulmonary embolism at this time.  He has a low Wells score.  He is also having identical symptoms to his prior ED visit, at which time his D-dimer was negative, and his pain improved with muscle relaxers.  Have a strong suspicion this is musculoskeletal pain.  I do not believe he needs a CT angiogram at this time   After the interventions noted above, I reevaluated the patient and found that they have: stayed the same  Social Determinants of Health: counseled patient on smoking cessation  Dispostion:  After consideration of the diagnostic results and the patients response to treatment, I feel that the patent would benefit from outpatient PCP follow-up.  We  will treat for what I strongly suspect is muscular pain, likely from overuse and persistent overhead arm raise, he may have a bursitis or tendinitis.  Recommended muscle relaxers and ibuprofen at home.  He verbalized understanding.         Final Clinical Impression(s) / ED Diagnoses Final diagnoses:  Acute left-sided thoracic back pain  Chest pain, unspecified type    Rx / DC Orders ED Discharge Orders          Ordered    cyclobenzaprine (FLEXERIL) 10 MG tablet  2 times daily PRN        03/28/22 0920    ibuprofen (ADVIL) 600 MG tablet  Every 6 hours PRN        03/28/22 0920              Terald Sleeper, MD 03/28/22 [id number]

## 2022-04-29 ENCOUNTER — Encounter: Payer: Self-pay | Admitting: Gastroenterology

## 2022-05-16 ENCOUNTER — Ambulatory Visit (AMBULATORY_SURGERY_CENTER): Payer: Managed Care, Other (non HMO) | Admitting: *Deleted

## 2022-05-16 VITALS — Ht 64.0 in | Wt 172.0 lb

## 2022-05-16 DIAGNOSIS — Z1211 Encounter for screening for malignant neoplasm of colon: Secondary | ICD-10-CM

## 2022-05-16 MED ORDER — NA SULFATE-K SULFATE-MG SULF 17.5-3.13-1.6 GM/177ML PO SOLN: 1.0000 | Freq: Once | ORAL | 0 refills | Status: AC

## 2022-05-16 NOTE — Progress Notes (Addendum)
No egg or soy allergy known to patient  No issues known to pt with past sedation with any surgeries or procedures Patient denies ever being told they had issues or difficulty with intubation  No FH of Malignant Hyperthermia Pt is not on diet pills Pt is not on  home 02  Pt is not on blood thinners  Pt denies issues with constipation  Pt is not on dialysis Pt denies any upcoming cardiac testing Pt encouraged to use to use Singlecare or Goodrx to reduce cost  Patient's chart reviewed by Osvaldo Angst CNRA prior to previsit and patient appropriate for the [facility].  Previsit completed and red dot placed by patient's name on their procedure day (on provider's schedule).  . Visit by phone Instructions reviewed with pt and pt states understanding. Instructed to review again prior to procedure. Pt states they will. Instructions sent by mail with coupon

## 2022-05-22 ENCOUNTER — Encounter: Payer: Self-pay | Admitting: Gastroenterology

## 2022-05-27 ENCOUNTER — Other Ambulatory Visit (INDEPENDENT_AMBULATORY_CARE_PROVIDER_SITE_OTHER): Payer: Managed Care, Other (non HMO)

## 2022-05-27 DIAGNOSIS — R972 Elevated prostate specific antigen [PSA]: Secondary | ICD-10-CM

## 2022-05-27 DIAGNOSIS — R7989 Other specified abnormal findings of blood chemistry: Secondary | ICD-10-CM | POA: Diagnosis not present

## 2022-05-27 LAB — HEPATIC FUNCTION PANEL
ALT: 96 U/L — ABNORMAL HIGH (ref 0–53)
AST: 50 U/L — ABNORMAL HIGH (ref 0–37)
Albumin: 3.7 g/dL (ref 3.5–5.2)
Alkaline Phosphatase: 124 U/L — ABNORMAL HIGH (ref 39–117)
Bilirubin, Direct: 0.1 mg/dL (ref 0.0–0.3)
Total Bilirubin: 0.4 mg/dL (ref 0.2–1.2)
Total Protein: 6.8 g/dL (ref 6.0–8.3)

## 2022-05-27 LAB — PSA: PSA: 5.6 ng/mL — ABNORMAL HIGH (ref 0.10–4.00)

## 2022-05-28 ENCOUNTER — Telehealth: Payer: Self-pay | Admitting: Nurse Practitioner

## 2022-05-28 DIAGNOSIS — R748 Abnormal levels of other serum enzymes: Secondary | ICD-10-CM

## 2022-05-28 DIAGNOSIS — R972 Elevated prostate specific antigen [PSA]: Secondary | ICD-10-CM

## 2022-05-28 NOTE — Telephone Encounter (Signed)
Called pt and he has his imaging and colonoscopy scheduled.

## 2022-05-28 NOTE — Telephone Encounter (Signed)
Referral for urology placed. They should contact the patient with in 2 weeks. If he has not heard from them he needs to let us know  I have placed the order for the [place] at [facility] [address]. He will need to call and get it scheduled if you will give him that information please

## 2022-05-28 NOTE — Telephone Encounter (Signed)
-----   Message from Barkley Bruns, [place] sent at 05/28/2022  8:14 AM EST ----- Called patient reviewed all information and repeated back to me. Will call if any questions.  He stated that he would like a urology referral and the liver US in [place].

## 2022-06-06 ENCOUNTER — Encounter: Payer: Self-pay | Admitting: Gastroenterology

## 2022-06-06 ENCOUNTER — Ambulatory Visit: Payer: Managed Care, Other (non HMO) | Admitting: Gastroenterology

## 2022-06-06 VITALS — BP 123/85 | HR 59 | Temp 98.0°F | Resp 16 | Ht 64.0 in | Wt 172.0 lb

## 2022-06-06 DIAGNOSIS — D125 Benign neoplasm of sigmoid colon: Secondary | ICD-10-CM | POA: Diagnosis not present

## 2022-06-06 DIAGNOSIS — D123 Benign neoplasm of transverse colon: Secondary | ICD-10-CM | POA: Diagnosis not present

## 2022-06-06 DIAGNOSIS — K635 Polyp of colon: Secondary | ICD-10-CM | POA: Diagnosis not present

## 2022-06-06 DIAGNOSIS — Z1211 Encounter for screening for malignant neoplasm of colon: Secondary | ICD-10-CM | POA: Diagnosis present

## 2022-06-06 MED ORDER — SODIUM CHLORIDE 0.9 % IV SOLN: 500.0000 mL | Freq: Once | INTRAVENOUS | Status: DC

## 2022-06-06 NOTE — Progress Notes (Signed)
Pt's states no medical or surgical changes since previsit or office visit. 

## 2022-06-06 NOTE — Progress Notes (Signed)
Report to PACU, RN, vss, BBS= Clear.  

## 2022-06-06 NOTE — Progress Notes (Signed)
Called to room to assist during endoscopic procedure.  Patient ID and intended procedure confirmed with present staff. Received instructions for my participation in the procedure from the performing physician.  

## 2022-06-06 NOTE — Op Note (Signed)
[facility] Patient Name: Grant Bishop Procedure Date: 06/06/2022 10:05 AM MRN: [id number] Endoscopist: Thornton Park MD, MD, [id number] Age: 57 Referring MD:  Date of Birth: 09-May-1965 Gender: Male Account #: [id number] Procedure:                Colonoscopy Indications:              Screening for colorectal malignant neoplasm, This                            is the patient's first colonoscopy                           No known family history of colon cancer or polyps Medicines:                Monitored Anesthesia Care Procedure:                Pre-Anesthesia Assessment:                           - Prior to the procedure, a History and Physical                            was performed, and patient medications and                            allergies were reviewed. The patient's tolerance of                            previous anesthesia was also reviewed. The risks                            and benefits of the procedure and the sedation                            options and risks were discussed with the patient.                            All questions were answered, and informed consent                            was obtained. Prior Anticoagulants: The patient has                            taken no anticoagulant or antiplatelet agents. ASA                            Grade Assessment: II - A patient with mild systemic                            disease. After reviewing the risks and benefits,                            the patient was deemed in satisfactory condition to  undergo the procedure.                           After obtaining informed consent, the colonoscope                            was passed under direct vision. Throughout the                            procedure, the patient's blood pressure, pulse, and                            oxygen saturations were monitored continuously. The                            CF [id number] was  introduced through the anus                            and advanced to the 3 cm into the ileum. A second                            forward view of the right colon was performed. The                            colonoscopy was performed without difficulty. The                            patient tolerated the procedure well. The quality                            of the bowel preparation was excellent. The                            terminal ileum, ileocecal valve, appendiceal                            orifice, and rectum were photographed. Scope In: 10:16:11 AM Scope Out: 10:29:45 AM Scope Withdrawal Time: 0 hours 11 minutes 1 second  Total Procedure Duration: 0 hours 13 minutes 34 seconds  Findings:                 The perianal and digital rectal examinations were                            normal.                           Multiple medium-mouthed and small-mouthed                            diverticula were found in the sigmoid colon and                            descending colon.  Two sessile polyps were found in the sigmoid colon.                            The polyps were 2 to 3 mm in size. These polyps                            were removed with a cold snare. Resection and                            retrieval were complete. Estimated blood loss was                            minimal.                           Two sessile polyps were found in the transverse                            colon. The polyps were 2 to 3 mm in size. These                            polyps were removed with a cold snare. Resection                            and retrieval were complete. Estimated blood loss                            was minimal.                           The exam was otherwise without abnormality on                            direct and retroflexion views. Complications:            No immediate complications. Estimated Blood Loss:     Estimated blood loss was  minimal. Impression:               - Diverticulosis in the sigmoid colon and in the                            descending colon.                           - Two 2 to 3 mm polyps in the sigmoid colon,                            removed with a cold snare. Resected and retrieved.                           - Two 2 to 3 mm polyps in the transverse colon,                            removed with a cold snare.  Resected and retrieved.                           - The examination was otherwise normal on direct                            and retroflexion views. Recommendation:           - Patient has a contact number available for                            emergencies. The signs and symptoms of potential                            delayed complications were discussed with the                            patient. Return to normal activities tomorrow.                            Written discharge instructions were provided to the                            patient.                           - High fiber diet. Recommend adding a daily dose of                            Metamucil or Benefiber.                           - Continue present medications.                           - Await pathology results.                           - Repeat colonoscopy date to be determined after                            pending pathology results are reviewed for                            surveillance.                           - Emerging evidence supports eating a diet of                            fruits, vegetables, grains, calcium, and yogurt                            while reducing red meat and alcohol may reduce the                            risk  of colon cancer.                           - Thank you for allowing me to be involved in your                            colon cancer prevention. Thornton Park MD, MD 06/06/2022 10:36:35 AM This report has been signed electronically.

## 2022-06-06 NOTE — Progress Notes (Signed)
   Referring Provider: Michela Pitcher, NP Primary Care Physician:  Michela Pitcher, NP    Indication for Colonoscopy:  Colon cancer screening   IMPRESSION:  Need for colon cancer screening Appropriate candidate for monitored anesthesia care  PLAN: Colonoscopy in the [facility] today   HPI: Grant Bishop is a 57 y.o. male presents for screening colonoscopy.  No prior colonoscopy or colon cancer screening.  No known family history of colon cancer or polyps. No family history of uterine/endometrial cancer, pancreatic cancer or gastric/stomach cancer.   Past Medical History:  Diagnosis Date   Hypertension     Past Surgical History:  Procedure Laterality Date   right knee replacement Right    15 yrs ago    No current outpatient medications on file.   Current Facility-Administered Medications  Medication Dose Route Frequency Provider Last Rate Last Admin   0.9 %  sodium chloride infusion  500 mL Intravenous Once Thornton Park, MD        Allergies as of 06/06/2022   (No Known Allergies)    Family History  Problem Relation Age of Onset   COPD Mother        breast   Cancer Mother    Alcohol abuse Father    Cirrhosis Father    Colon cancer Neg Hx    Colon polyps Neg Hx    Esophageal cancer Neg Hx    Rectal cancer Neg Hx    Stomach cancer Neg Hx      Physical Exam: General:   Alert,  well-nourished, pleasant and cooperative in NAD Head:  Normocephalic and atraumatic. Eyes:  Sclera clear, no icterus.   Conjunctiva pink. Mouth:  No deformity or lesions.   Neck:  Supple; no masses or thyromegaly. Lungs:  Clear throughout to auscultation.   No wheezes. Heart:  Regular rate and rhythm; no murmurs. Abdomen:  Soft, non-tender, nondistended, normal bowel sounds, no rebound or guarding.  Msk:  Symmetrical. No boney deformities LAD: No inguinal or umbilical LAD Extremities:  No clubbing or edema. Neurologic:  Alert and  oriented x4;  grossly nonfocal Skin:  No obvious rash  or bruise. Psych:  Alert and cooperative. Normal mood and affect.     Studies/Results: No results found.    Brunetta Newingham L. Tarri Glenn, MD, MPH 06/06/2022, 9:57 AM

## 2022-06-06 NOTE — Patient Instructions (Addendum)
-   Continue present medications.  - Await pathology results.  - Follow a high fiber diet. Drink at least 64 ounces of water daily. Add a daily stool bulking agent such as psyllium (an exampled would be Metamucil) Handouts given for high fiber diet, polyps, diverticulosis   YOU HAD AN ENDOSCOPIC PROCEDURE TODAY AT [facility]:   Refer to the procedure report that was given to you for any specific questions about what was found during the examination.  If the procedure report does not answer your questions, please call your gastroenterologist to clarify.  If you requested that your care partner not be given the details of your procedure findings, then the procedure report has been included in a sealed envelope for you to review at your convenience later.  YOU SHOULD EXPECT: Some feelings of bloating in the abdomen. Passage of more gas than usual.  Walking can help get rid of the air that was put into your GI tract during the procedure and reduce the bloating. If you had a lower endoscopy (such as a colonoscopy or flexible sigmoidoscopy) you may notice spotting of blood in your stool or on the toilet paper. If you underwent a bowel prep for your procedure, you may not have a normal bowel movement for a few days.  Please Note:  You might notice some irritation and congestion in your nose or some drainage.  This is from the oxygen used during your procedure.  There is no need for concern and it should clear up in a day or so.  SYMPTOMS TO REPORT IMMEDIATELY: Following lower endoscopy (colonoscopy):  Excessive amounts of blood in the stool  Significant tenderness or worsening of abdominal pains  Swelling of the abdomen that is new, acute  Fever of 100F or higher For urgent or emergent issues, a gastroenterologist can be reached at any hour by calling [phone]. Do not use MyChart messaging for urgent concerns.   DIET:  We do recommend a small meal at first, but then you may  proceed to your regular diet.  Drink plenty of fluids but you should avoid alcoholic beverages for 24 hours.  ACTIVITY:  You should plan to take it easy for the rest of today and you should NOT DRIVE or use heavy machinery until tomorrow (because of the sedation medicines used during the test).    FOLLOW UP: Our staff will call the number listed on your records the next business day following your procedure.  We will call around 7:15- 8:00 am to check on you and address any questions or concerns that you may have regarding the information given to you following your procedure. If we do not reach you, we will leave a message.     If any biopsies were taken you will be contacted by phone or by letter within the next 1-3 weeks.  Please call us at [phone] if you have not heard about the biopsies in 3 weeks.    SIGNATURES/CONFIDENTIALITY: You and/or your care partner have signed paperwork which will be entered into your electronic medical record.  These signatures attest to the fact that that the information above on your After Visit Summary has been reviewed and is understood.  Full responsibility of the confidentiality of this discharge information lies with you and/or your care-partner.

## 2022-06-07 ENCOUNTER — Telehealth: Payer: Self-pay | Admitting: *Deleted

## 2022-06-07 NOTE — Telephone Encounter (Signed)
  Follow up Call-     06/06/2022    9:12 AM  Call back number  Post procedure Call Back phone  # [phone]  Permission to leave phone message Yes     Patient questions:  Do you have a fever, pain , or abdominal swelling? No. Pain Score  0 *  Have you tolerated food without any problems? Yes.    Have you been able to return to your normal activities? Yes.    Do you have any questions about your discharge instructions: Diet   No. Medications  No. Follow up visit  No.  Do you have questions or concerns about your Care? No.  Actions: * If pain score is 4 or above: No action needed, pain <4.

## 2022-06-13 ENCOUNTER — Encounter: Payer: Self-pay | Admitting: Gastroenterology

## 2022-06-26 ENCOUNTER — Other Ambulatory Visit: Payer: Managed Care, Other (non HMO)

## 2022-07-18 ENCOUNTER — Ambulatory Visit
Admission: RE | Admit: 2022-07-18 | Discharge: 2022-07-18 | Disposition: A | Discharge status: Home / Self Care | Payer: Managed Care, Other (non HMO) | Source: Ambulatory Visit | Attending: Nurse Practitioner | Admitting: Nurse Practitioner

## 2022-07-18 DIAGNOSIS — R748 Abnormal levels of other serum enzymes: Secondary | ICD-10-CM
